# Patient Record
Sex: Female | Born: 1960 | Hispanic: No | Smoking: Never smoker
Health system: Southern US, Community
[De-identification: ages and names within clinical notes are randomized; demographics above are authoritative.]

## PROBLEM LIST (undated history)

## (undated) DIAGNOSIS — E7800 Pure hypercholesterolemia, unspecified: Secondary | ICD-10-CM

## (undated) HISTORY — PX: APPENDECTOMY: SHX54

---

## 1999-03-26 ENCOUNTER — Encounter: Payer: Self-pay | Admitting: Emergency Medicine

## 1999-03-26 ENCOUNTER — Emergency Department (HOSPITAL_COMMUNITY): Admission: EM | Admit: 1999-03-26 | Discharge: 1999-03-26 | Payer: Self-pay | Admitting: Emergency Medicine

## 1999-03-28 ENCOUNTER — Emergency Department (HOSPITAL_COMMUNITY): Admission: EM | Admit: 1999-03-28 | Discharge: 1999-03-28 | Payer: Self-pay

## 2000-07-26 ENCOUNTER — Emergency Department (HOSPITAL_COMMUNITY): Admission: EM | Admit: 2000-07-26 | Discharge: 2000-07-26 | Payer: Self-pay | Admitting: Internal Medicine

## 2012-07-02 ENCOUNTER — Emergency Department (HOSPITAL_COMMUNITY)
Admission: EM | Admit: 2012-07-02 | Discharge: 2012-07-03 | Disposition: A | Discharge status: Home / Self Care | Payer: Self-pay | Attending: Emergency Medicine | Admitting: Emergency Medicine

## 2012-07-02 ENCOUNTER — Encounter (HOSPITAL_COMMUNITY): Payer: Self-pay | Admitting: Emergency Medicine

## 2012-07-02 DIAGNOSIS — R109 Unspecified abdominal pain: Secondary | ICD-10-CM

## 2012-07-02 DIAGNOSIS — Z862 Personal history of diseases of the blood and blood-forming organs and certain disorders involving the immune mechanism: Secondary | ICD-10-CM | POA: Insufficient documentation

## 2012-07-02 DIAGNOSIS — Z7982 Long term (current) use of aspirin: Secondary | ICD-10-CM | POA: Insufficient documentation

## 2012-07-02 DIAGNOSIS — R1031 Right lower quadrant pain: Secondary | ICD-10-CM | POA: Insufficient documentation

## 2012-07-02 DIAGNOSIS — Z8639 Personal history of other endocrine, nutritional and metabolic disease: Secondary | ICD-10-CM | POA: Insufficient documentation

## 2012-07-02 HISTORY — DX: Pure hypercholesterolemia, unspecified: E78.00

## 2012-07-02 NOTE — ED Provider Notes (Signed)
History     CSN: [id number]  Arrival date & time 07/02/12  2304   First MD Initiated Contact with Patient 07/02/12 2322      Chief Complaint  Patient presents with  . Abdominal Pain    (Consider location/radiation/quality/duration/timing/severity/associated sxs/prior treatment) HPI  Donna Dixon is a generally healthy 52 yo [place] F who presents after an episode of RLQ abdominal pain. Sx began abruptly around 2130 with a feeling of being "hot all over". Pain was initially severe but resolved gradually and without intervention over the next hour. Patient has been symptom free in the ED for the past 1 hr or so.   She has associated nausea but, no vomiting. She had a single episode of non-bloody diarrhea. Denies GU sx - burning, hesitancy, hematuria. No history of similar sx. No history of abdominal surgeries.   The patient moved to [place] from [place] earlier in the month and has not received any primary medical care in several years.   Daughter notes that the patient did a lot of walking and excersize today.   Past Medical History  Diagnosis Date  . High cholesterol     History reviewed. No pertinent past surgical history.  No family history on file.  History  Substance Use Topics  . Smoking status: Never Smoker   . Smokeless tobacco: Not on file  . Alcohol Use: Yes    OB History   Grav Para Term Preterm Abortions TAB SAB Ect Mult Living                  Review of Systems Gen: no weight loss, fevers, chills, night sweats Eyes: no discharge or drainage, no occular pain or visual changes Nose: no epistaxis or rhinorrhea Mouth: no dental pain, no sore throat Neck: no neck pain Lungs: no SOB, cough, wheezing CV: no chest pain, palpitations, dependent edema or orthopnea Abd: as per history of present illness, otherwise normal GU: no dysuria or gross hematuria MSK: no myalgias or arthralgias Neuro: no headache, no focal neurologic deficits Skin: no rash Psyche:  negative.  Allergies  Review of patient's allergies indicates no known allergies.  Home Medications  No current outpatient prescriptions on file.  BP 123/69  Pulse 78  Temp(Src) 97.9 F (36.6 C) (Oral)  Resp 16  SpO2 99%  Physical Exam Gen: well developed and well nourished appearing Head: NCAT Eyes: PERL, EOMI Nose: no epistaixis or rhinorrhea Mouth/throat: mucosa is moist and pink Neck: supple, no stridor Lungs: CTA B, no wheezing, rhonchi or rales Abd: soft, notender, nondistended Back: no ttp, no cva ttp Skin: no rashese, wnl Neuro: CN ii-xii grossly intact, no focal deficits Psyche; normal affect,  calm and cooperative.   ED Course  Procedures (including critical care time)  Results for orders placed during the hospital encounter of 07/02/12 (from the past 24 hour(s))  CBC WITH DIFFERENTIAL     Status: Abnormal   Collection Time    07/02/12 11:13 PM      Result Value Range   WBC 5.8  4.0 - 10.5 K/uL   RBC 4.59  3.87 - 5.11 MIL/uL   Hemoglobin 11.1 (*) 12.0 - 15.0 g/dL   HCT 11.9 (*) 14.7 - 82.9 %   MCV 75.6 (*) 78.0 - 100.0 fL   MCH 24.2 (*) 26.0 - 34.0 pg   MCHC 32.0  30.0 - 36.0 g/dL   RDW 56.2 (*) 13.0 - 86.5 %   Platelets 176  150 - 400 K/uL  Neutrophils Relative % 49  43 - 77 %   Neutro Abs 2.8  1.7 - 7.7 K/uL   Lymphocytes Relative 41  12 - 46 %   Lymphs Abs 2.4  0.7 - 4.0 K/uL   Monocytes Relative 8  3 - 12 %   Monocytes Absolute 0.5  0.1 - 1.0 K/uL   Eosinophils Relative 1  0 - 5 %   Eosinophils Absolute 0.1  0.0 - 0.7 K/uL   Basophils Relative 1  0 - 1 %   Basophils Absolute 0.1  0.0 - 0.1 K/uL  COMPREHENSIVE METABOLIC PANEL     Status: Abnormal   Collection Time    07/02/12 11:13 PM      Result Value Range   Sodium 142  135 - 145 mEq/L   Potassium 4.2  3.5 - 5.1 mEq/L   Chloride 104  96 - 112 mEq/L   CO2 25  19 - 32 mEq/L   Glucose, Bld 98  70 - 99 mg/dL   BUN 15  6 - 23 mg/dL   Creatinine, Ser 1.61  0.50 - 1.10 mg/dL   Calcium 9.4   8.4 - 09.6 mg/dL   Total Protein 7.1  6.0 - 8.3 g/dL   Albumin 3.7  3.5 - 5.2 g/dL   AST 40 (*) 0 - 37 U/L   ALT 25  0 - 35 U/L   Alkaline Phosphatase 61  39 - 117 U/L   Total Bilirubin 0.2 (*) 0.3 - 1.2 mg/dL   GFR calc non Af Amer >90  >90 mL/min   GFR calc Af Amer >90  >90 mL/min  LIPASE, BLOOD     Status: None   Collection Time    07/02/12 11:13 PM      Result Value Range   Lipase 46  11 - 59 U/L  URINALYSIS, ROUTINE W REFLEX MICROSCOPIC     Status: None   Collection Time    07/03/12 12:12 AM      Result Value Range   Color, Urine YELLOW  YELLOW   APPearance CLEAR  CLEAR   Specific Gravity, Urine 1.016  1.005 - 1.030   pH 7.5  5.0 - 8.0   Glucose, UA NEGATIVE  NEGATIVE mg/dL   Hgb urine dipstick NEGATIVE  NEGATIVE   Bilirubin Urine NEGATIVE  NEGATIVE   Ketones, ur NEGATIVE  NEGATIVE mg/dL   Protein, ur NEGATIVE  NEGATIVE mg/dL   Urobilinogen, UA 0.2  0.0 - 1.0 mg/dL   Nitrite NEGATIVE  NEGATIVE   Leukocytes, UA NEGATIVE  NEGATIVE      MDM  Patient pain free throughout ED stay. Exam is benign. VS are wnl. CBC, CMP, lipase and U/A are wnl.  Patient is stable for discharge.         Brandt Loosen, MD 07/03/12 [phone]

## 2012-07-02 NOTE — ED Notes (Signed)
PT. REPORTS RLQ PAIN WITH EMESIS AND DIARRHEA / SLIGHT CHILLS ONSET THIS EVENING , DENIES DYSURIA OR FEVER . DAUGHTER TRANSLATING ( LAOSIAN  ) FOR PT.

## 2012-07-03 LAB — CBC WITH DIFFERENTIAL/PLATELET
Basophils Absolute: 0.1 10*3/uL (ref 0.0–0.1)
Basophils Relative: 1 % (ref 0–1)
Eosinophils Relative: 1 % (ref 0–5)
HCT: 34.7 % — ABNORMAL LOW (ref 36.0–46.0)
Lymphocytes Relative: 41 % (ref 12–46)
MCHC: 32 g/dL (ref 30.0–36.0)
MCV: 75.6 fL — ABNORMAL LOW (ref 78.0–100.0)
Monocytes Absolute: 0.5 10*3/uL (ref 0.1–1.0)
RDW: 15.6 % — ABNORMAL HIGH (ref 11.5–15.5)

## 2012-07-03 LAB — URINALYSIS, ROUTINE W REFLEX MICROSCOPIC
Glucose, UA: NEGATIVE mg/dL
Leukocytes, UA: NEGATIVE
Nitrite: NEGATIVE
Protein, ur: NEGATIVE mg/dL
Urobilinogen, UA: 0.2 mg/dL (ref 0.0–1.0)

## 2012-07-03 LAB — COMPREHENSIVE METABOLIC PANEL
AST: 40 U/L — ABNORMAL HIGH (ref 0–37)
CO2: 25 mEq/L (ref 19–32)
Calcium: 9.4 mg/dL (ref 8.4–10.5)
Creatinine, Ser: 0.66 mg/dL (ref 0.50–1.10)
GFR calc Af Amer: >90 mL/min (ref >90)
GFR calc non Af Amer: >90 mL/min (ref >90)

## 2012-07-03 LAB — LIPASE, BLOOD: Lipase: 46 U/L (ref 11–59)

## 2012-07-03 NOTE — ED Notes (Signed)
[place] pt presenets with daughter who is translating for pt.  Pt began having acute abd pain and vomiting at 2130 hrs 07/03/12.  Pain located in RLQ without radaition, but associated with nausea, vomiting x 1.  Had diarrhea stool times one prior to pain.  At present pt is painfree

## 2018-12-04 ENCOUNTER — Emergency Department (HOSPITAL_BASED_OUTPATIENT_CLINIC_OR_DEPARTMENT_OTHER): Payer: Self-pay

## 2018-12-04 ENCOUNTER — Emergency Department (HOSPITAL_BASED_OUTPATIENT_CLINIC_OR_DEPARTMENT_OTHER)
Admission: EM | Admit: 2018-12-04 | Discharge: 2018-12-04 | Disposition: A | Discharge status: Home / Self Care | Payer: Self-pay | Attending: Emergency Medicine | Admitting: Emergency Medicine

## 2018-12-04 ENCOUNTER — Other Ambulatory Visit: Payer: Self-pay

## 2018-12-04 ENCOUNTER — Encounter (HOSPITAL_BASED_OUTPATIENT_CLINIC_OR_DEPARTMENT_OTHER): Payer: Self-pay

## 2018-12-04 DIAGNOSIS — M25562 Pain in left knee: Secondary | ICD-10-CM

## 2018-12-04 DIAGNOSIS — Z79899 Other long term (current) drug therapy: Secondary | ICD-10-CM | POA: Insufficient documentation

## 2018-12-04 NOTE — Discharge Instructions (Signed)
Take Tylenol and Ibuprofen as needed for pain. Do not take more than 4000 mg Tylenol or more than 2400 mg Ibuprofen daily. Ice, elevated extremity.  Follow up with Orthopedics for reevaluation.

## 2018-12-04 NOTE — ED Provider Notes (Addendum)
[facility] EMERGENCY DEPARTMENT Provider Note   CSN: [id number] Arrival date & time: 12/04/18  1631     History   Chief Complaint Chief Complaint  Patient presents with  . Fall    HPI Donna Dixon is a 58 y.o. female with past medical history who presents for evaluation of knee pain.  Patient states she tripped and fell while stepping over a dog gate.  She landed on her left knee.  She denies hitting her head, LOC or anticoagulation.  She was ambulatory after the fall.  Patient states she has anterior left knee pain.  Worse with ambulation.  She does not take anything for pain.  She rates her current pain a 3/10.  Denies redness, swelling, warmth, decreased range of motion, fever, nausea, vomiting, paresthesias to extremities.  And additional aggravating or alleviating factors.  She has been ambulatory at home without difficulty.  Denies additional aggravating or alleviating factors. She does not want anything for pain in the ED.  History obtained from patient and past medical records.  Patient needed languages [place].  She refuses medical interpreter.  She insists that her daughter in room interprets for her.     HPI  Past Medical History:  Diagnosis Date  . High cholesterol     There are no active problems to display for this patient.   Past Surgical History:  Procedure Laterality Date  . APPENDECTOMY       OB History   No obstetric history on file.      Home Medications    Prior to Admission medications   Medication Sig Start Date End Date Taking? Authorizing Provider  Aspirin-Acetaminophen-Caffeine (EXCEDRIN PO) Take 1 tablet by mouth daily as needed (for pain).    [provider]  Multiple Vitamin (MULTIVITAMIN WITH MINERALS) TABS Take 1 tablet by mouth daily.    [provider]    Family History No family history on file.  Social History Social History   Tobacco Use  . Smoking status: Never Smoker  . Smokeless tobacco:  Never Used  Substance Use Topics  . Alcohol use: Yes    Frequency: Never  . Drug use: No     Allergies   Patient has no known allergies.   Review of Systems Review of Systems  Constitutional: Negative.   HENT: Negative.   Respiratory: Negative.   Cardiovascular: Negative.   Gastrointestinal: Negative.   Genitourinary: Negative.   Musculoskeletal:       Left knee pain  Neurological: Negative.   All other systems reviewed and are negative.    Physical Exam Updated Vital Signs BP (!) 167/85 (BP Location: Right Arm)   Pulse 62   Temp 99 F (37.2 C) (Oral)   Resp 20   Ht 5\' 1"  (1.549 m)   Wt 49.9 kg   SpO2 98%   BMI 20.78 kg/m   Physical Exam Vitals signs and nursing note reviewed.  Constitutional:      General: She is not in acute distress.    Appearance: She is well-developed. She is not ill-appearing or toxic-appearing.  HENT:     Head: Normocephalic and atraumatic.     Nose: Nose normal.     Mouth/Throat:     Mouth: Mucous membranes are moist.     Pharynx: Oropharynx is clear.  Eyes:     Pupils: Pupils are equal, round, and reactive to light.  Neck:     Musculoskeletal: Normal range of motion.  Cardiovascular:  Rate and Rhythm: Normal rate.  Pulmonary:     Effort: No respiratory distress.  Abdominal:     General: There is no distension.  Musculoskeletal: Normal range of motion.     Right hip: Normal.     Left hip: Normal.     Right knee: Normal.     Left knee: She exhibits normal range of motion, no swelling, no effusion, no ecchymosis, no deformity, no laceration, no erythema, normal alignment, no LCL laxity, normal patellar mobility, normal meniscus and no MCL laxity. Tenderness found. No medial joint line, no lateral joint line, no MCL, no LCL and no patellar tendon tenderness noted.     Right ankle: Normal.     Left ankle: Normal.     Left upper leg: Normal.     Right lower leg: Normal.     Left lower leg: Normal.       Legs:     Right  foot: Normal.     Left foot: Normal.     Comments: Full range of motion to bilateral knees with flexion, extension.  Negative varus, valgus stress to the left knee.  Negative anterior drawer.  No tenderness to tibia/fibula, femur.  Calves nontender bilaterally.  Compartments soft.  Tenderness to anterior left knee.  Able to straight leg raise without difficulty.  Pelvis stable to palpation.  No shortening or rotation of legs.  Skin:    General: Skin is warm and dry.     Capillary Refill: Capillary refill takes less than 2 seconds.     Comments: Old circular scar to left anterior knee approximately 2 x 1 cm.  No edema, erythema, ecchymosis or warmth.  No contusions or abrasions.  No lacerations.  Neurological:     Mental Status: She is alert.     Comments: Ambulatory without difficulty.  Intact sensation. 5/5 bilateral lower extremities without difficulty.     ED Treatments / Results  Labs (all labs ordered are listed, but only abnormal results are displayed) Labs Reviewed - No data to display  EKG None  Radiology Dg Knee Complete 4 Views Left  Result Date: 12/04/2018 CLINICAL DATA:  Fall with knee pain EXAM: LEFT KNEE - COMPLETE 4+ VIEW COMPARISON:  None. FINDINGS: An overlying elastic brace limits the ability to detect subtle fractures. No evidence of fracture, dislocation, or joint effusion. No evidence of arthropathy or other focal bone abnormality. Soft tissues are unremarkable. IMPRESSION: Negative. Electronically Signed   By: Romona Curls M.D.   On: 12/04/2018 17:30    Procedures Procedures (including critical care time)  Medications Ordered in ED Medications - No data to display  Initial Impression / Assessment and Plan / ED Course  I have reviewed the triage vital signs and the nursing notes.  Pertinent labs & imaging results that were available during my care of the patient were reviewed by me and considered in my medical decision making (see chart for details).   58 year old female appears otherwise well presents for evaluation of left knee pain after mechanical fall which occurred yesterday.  Denies hitting head, LOC or anticoagulation.  Pelvis stable to palpation and nontender.  No shortening or rotation of legs.  She has been ambulatory without difficulty.  She does have some mild tenderness to her anterior left patella.  Full range of motion without difficulty.  Neurovascularly intact.  Negative varus, valgus stress.  Negative anterior drawer.  She is able to straight leg raise without difficulty. Compartments soft. No obvious effusion on exam. No  evidence of vascular, tendon, ligament or acute muscle injury. She does not want thing for pain in the ED here today. Low suspicion for septic joint, gout given known injury.  RICE for symptomatic management. She already has a brace on her knee.  Follow-up with Ortho for reevaluation if symptoms do not improve.  The patient has been appropriately medically screened and/or stabilized in the ED. I have low suspicion for any other emergent medical condition which would require further screening, evaluation or treatment in the ED or require inpatient management.      Final Clinical Impressions(s) / ED Diagnoses   Final diagnoses:  Acute pain of left knee    ED Discharge Orders    None       Elray Dains A, PA-C 12/04/18 1735    Yanique Mulvihill A, PA-C 12/04/18 1736    Long, Arlyss RepressJoshua G, MD 12/05/18 1014

## 2018-12-04 NOTE — ED Triage Notes (Signed)
Pt speaks [place], pt's daughter in triage to interpret for pt. Pt refused using medical interpreter service.   Pt had a mechanical fall yesterday tripping over a barrier and landing on her L knee. Denies striking her head. Pt c/o L knee pain.

## 2021-06-14 IMAGING — CR DG KNEE COMPLETE 4+V*L*
4 series · 4 of 4 positions shown · non-contrast
Comparison: None.

CLINICAL DATA: Fall with knee pain

EXAM:
LEFT KNEE - COMPLETE 4+ VIEW

[t knee ap left]
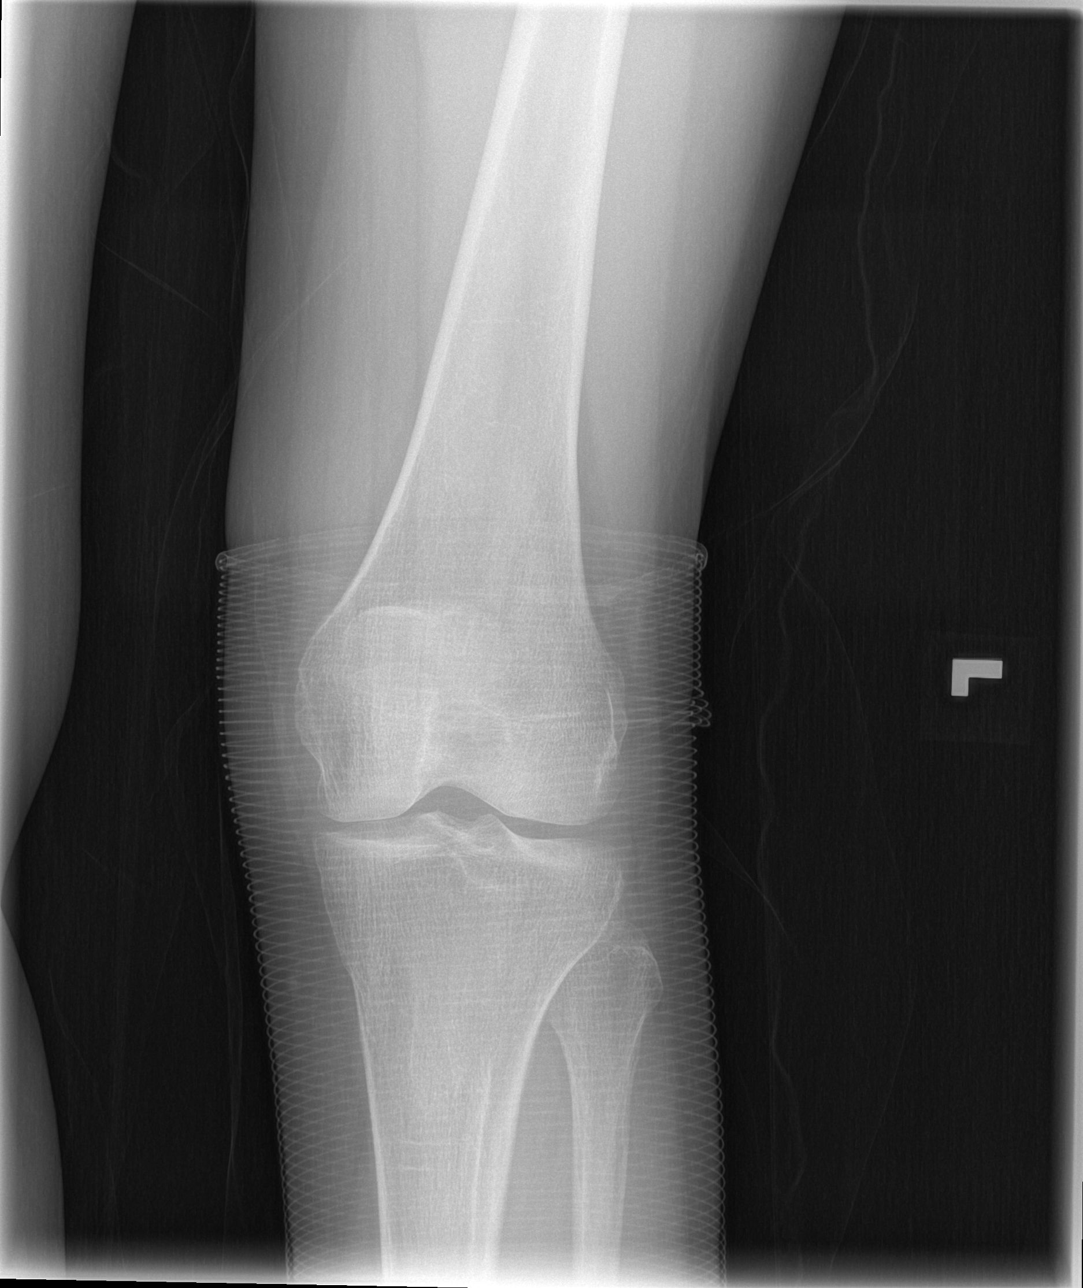

[t knee oblique left (1 of 2)]
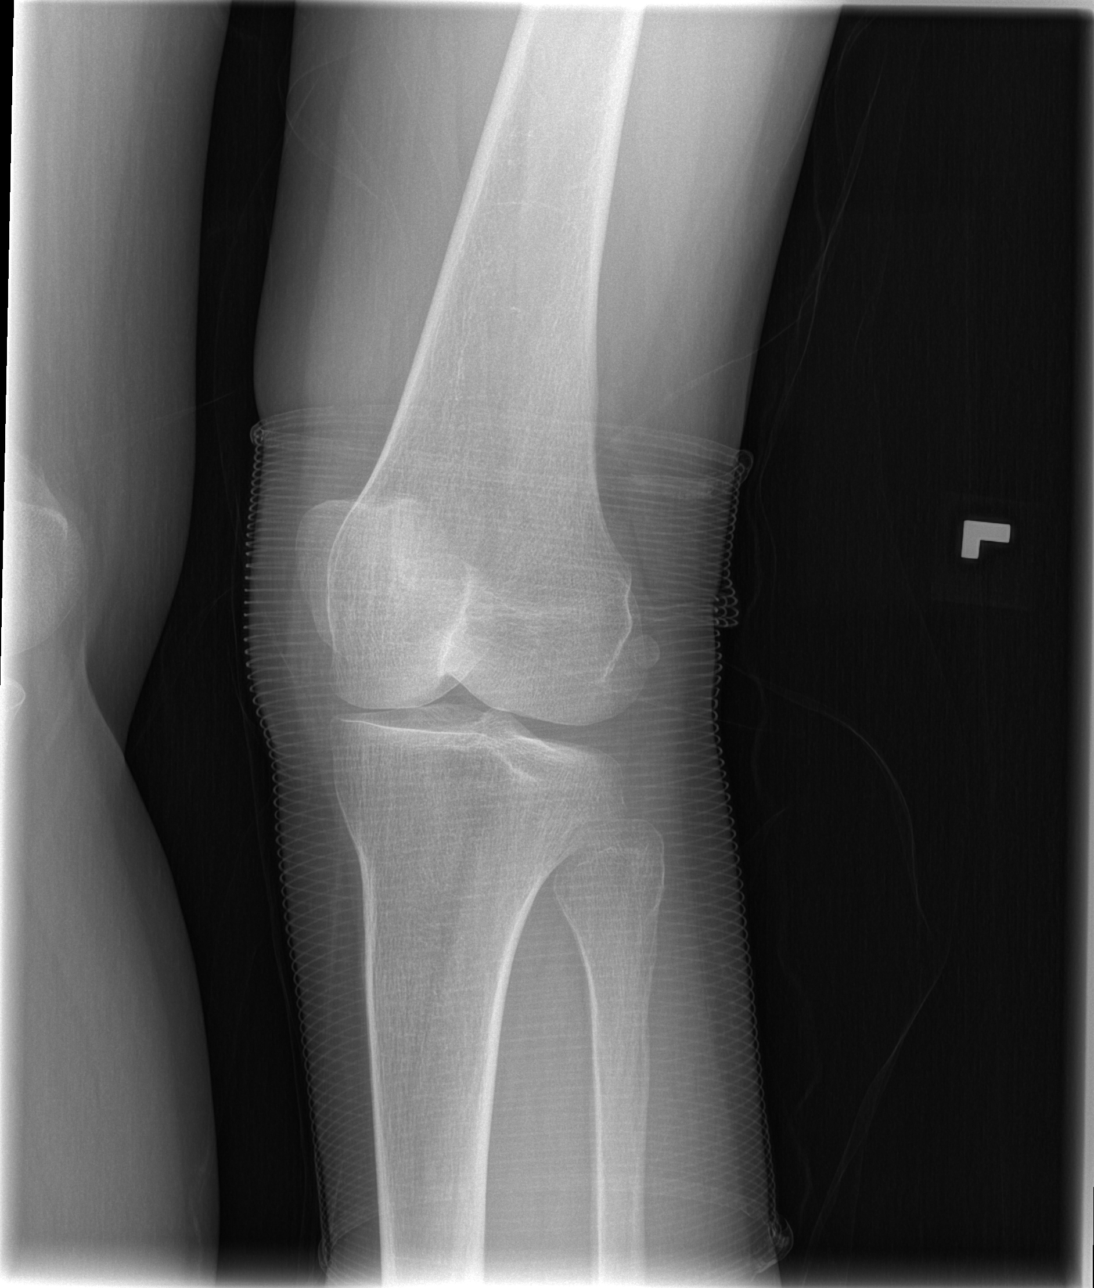

[t knee oblique left (2 of 2)]
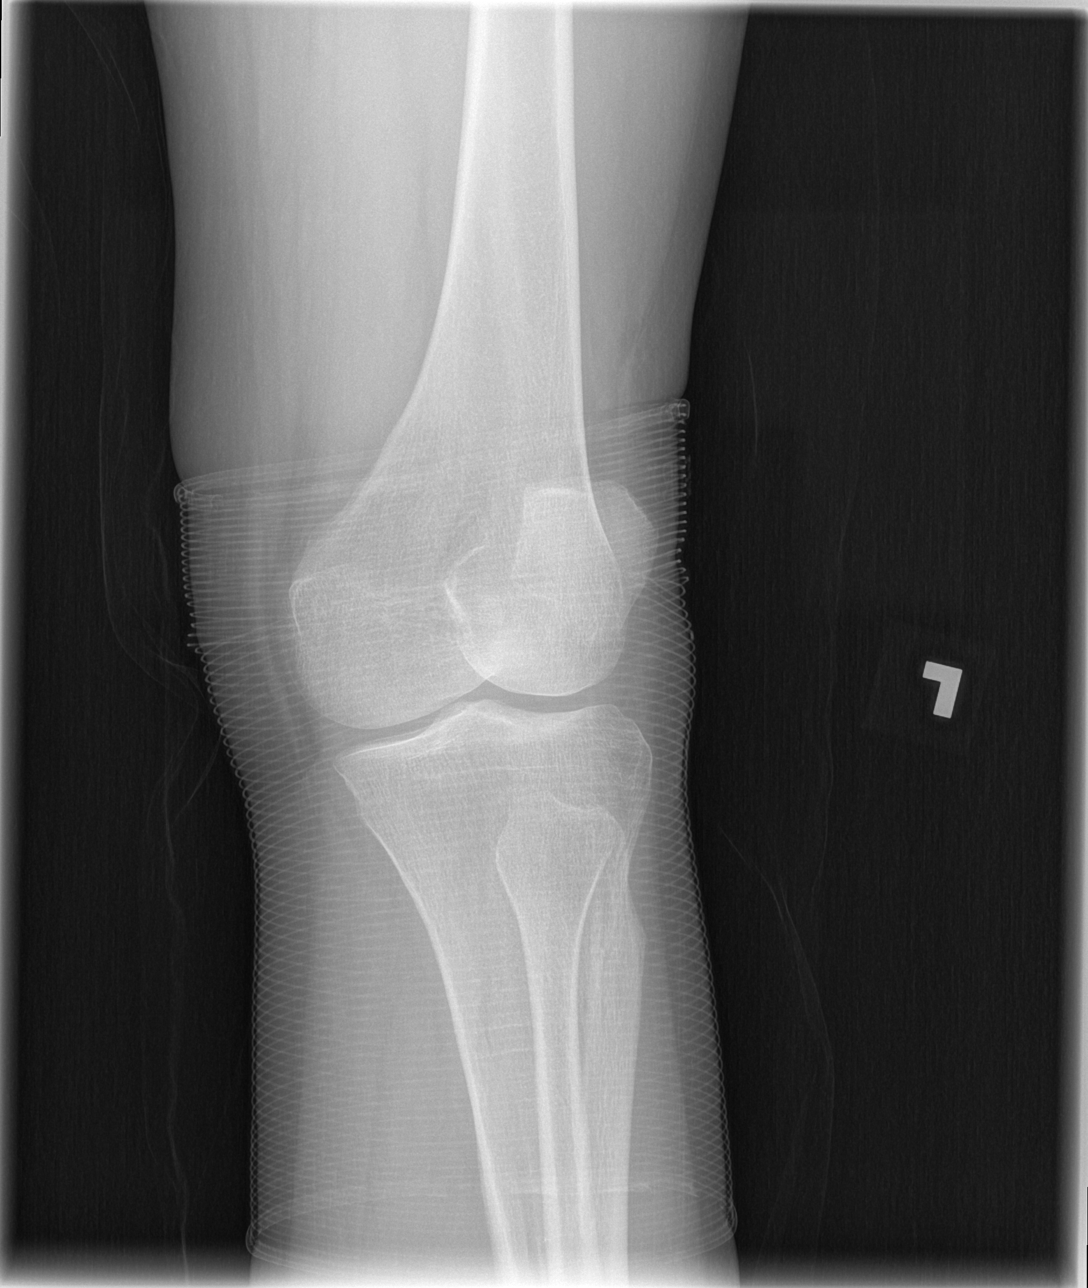

[t knee lat left]
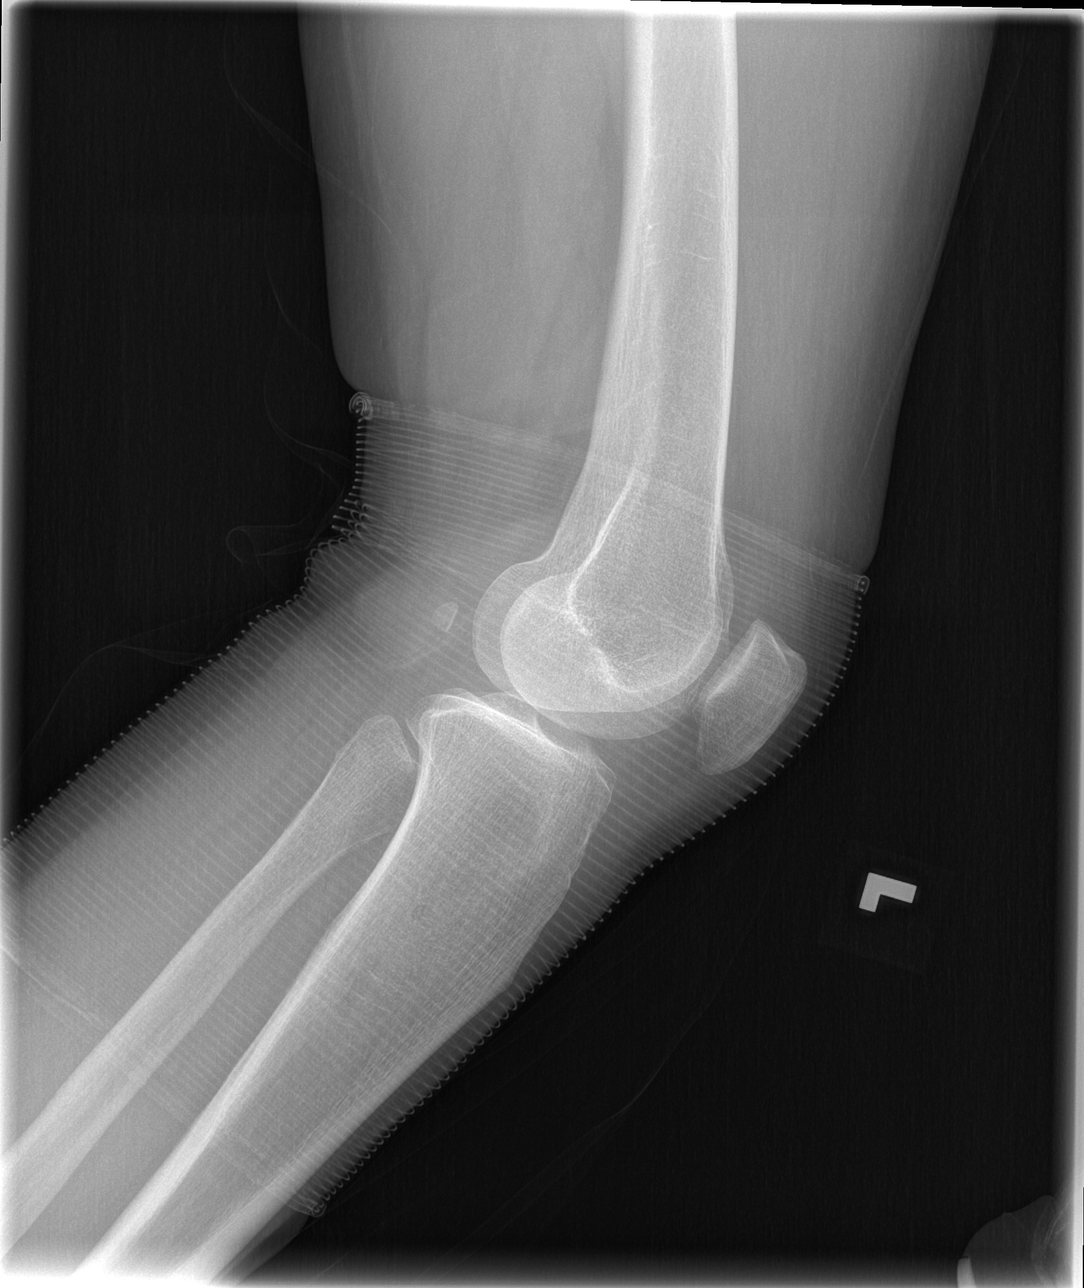

[4 of 4 positions shown; findings below may reference images not displayed]

FINDINGS: An overlying elastic brace limits the ability to detect subtle
fractures. No evidence of fracture, dislocation, or joint effusion.
No evidence of arthropathy or other focal bone abnormality. Soft
tissues are unremarkable.
IMPRESSION: Negative.

## 2023-01-20 ENCOUNTER — Emergency Department (HOSPITAL_COMMUNITY): Payer: Self-pay

## 2023-01-20 ENCOUNTER — Encounter (HOSPITAL_COMMUNITY): Payer: Self-pay

## 2023-01-20 ENCOUNTER — Emergency Department (HOSPITAL_COMMUNITY)
Admission: EM | Admit: 2023-01-20 | Discharge: 2023-01-20 | Disposition: A | Discharge status: Home / Self Care | Payer: Self-pay | Attending: Emergency Medicine | Admitting: Emergency Medicine

## 2023-01-20 ENCOUNTER — Other Ambulatory Visit: Payer: Self-pay

## 2023-01-20 DIAGNOSIS — W010XXA Fall on same level from slipping, tripping and stumbling without subsequent striking against object, initial encounter: Secondary | ICD-10-CM | POA: Insufficient documentation

## 2023-01-20 DIAGNOSIS — S82032A Displaced transverse fracture of left patella, initial encounter for closed fracture: Secondary | ICD-10-CM | POA: Insufficient documentation

## 2023-01-20 MED ORDER — HYDROCODONE-ACETAMINOPHEN 5-325 MG PO TABS: 1.0000 | ORAL_TABLET | Freq: Four times a day (QID) | ORAL | 0 refills | Status: DC | PRN

## 2023-01-20 MED ORDER — OXYCODONE-ACETAMINOPHEN 5-325 MG PO TABS
1.0000 | ORAL_TABLET | Freq: Once | ORAL | Status: AC
  Administered 2023-01-20: 1 via ORAL
  Filled 2023-01-20: qty 1

## 2023-01-20 MED ORDER — IBUPROFEN 200 MG PO TABS
600.0000 mg | ORAL_TABLET | Freq: Once | ORAL | Status: AC
  Administered 2023-01-20: 600 mg via ORAL
  Filled 2023-01-20: qty 1

## 2023-01-20 MED ORDER — IBUPROFEN 600 MG PO TABS: 600.0000 mg | ORAL_TABLET | Freq: Four times a day (QID) | ORAL | 0 refills | Status: DC | PRN

## 2023-01-20 NOTE — ED Notes (Signed)
Ortho tech called for immobilizer and crutches

## 2023-01-20 NOTE — Progress Notes (Signed)
Orthopedic Tech Progress Note Patient Details:  Donna Dixon [birth year] [id number] Applied knee immobilizer and trained on used of crutches  Ortho Devices Type of Ortho Device: Knee Immobilizer, Crutches Ortho Device/Splint Location: LLE Ortho Device/Splint Interventions: Ordered, Application, Adjustment   Post Interventions Patient Tolerated: Well Instructions Provided: Adjustment of device, Care of device  [name] 01/20/2023, 6:41 PM

## 2023-01-20 NOTE — ED Provider Triage Note (Signed)
Emergency Medicine Provider Triage Evaluation Note  Cheridan Nawabi , a 62 y.o. female  was evaluated in triage.  Pt complains of knee injury. Had a mechanical fall this AM when she slipped on wet ground and fell, struck her L knee.  No other injury.  Not on blood thinner.    Review of Systems  Positive: As above Negative: As above  Physical Exam  BP (!) 163/87   Pulse 81   Temp 98.4 F (36.9 C)   Resp 16   Ht 5\' 1"  (1.549 m)   Wt 59 kg   SpO2 98%   BMI 24.56 kg/m  Gen:   Awake, no distress   Resp:  Normal effort  MSK:   Moves extremities without difficulty  Other:    Medical Decision Making  Medically screening exam initiated at 2:05 PM.  Appropriate orders placed.  Sharniece Kamp was informed that the remainder of the evaluation will be completed by another provider, this initial triage assessment does not replace that evaluation, and the importance of remaining in the ED until their evaluation is complete.     Fayrene Helper, PA-C 01/20/23 1406

## 2023-01-20 NOTE — ED Triage Notes (Signed)
Pt came to ED for a fall today. Denies hitting head and no blood thinners. C/O left knee pain. Knee is bruised and swollen.

## 2023-01-20 NOTE — Discharge Instructions (Addendum)
Was a pleasure taking care of you.  You were seen today for a knee injury, you have a displaced fracture of the lateral portion of your left patella (knee cap).  Your knee in the immobilizer, use crutches, follow-up with orthopedics.  Do not put weight on your leg until you see orthopedics.  I spoke with Dr. Jena Gauss today.  He advised to call tomorrow to schedule appointment for next week.

## 2023-01-20 NOTE — ED Notes (Signed)
Ortho at bedside.

## 2023-01-20 NOTE — ED Provider Notes (Signed)
[place] EMERGENCY DEPARTMENT AT [facility] Provider Note   CSN: [id number] Arrival date & time: 01/20/23  1250     History  Chief Complaint  Patient presents with   [facility] [place] is a 62 y.o. female.  She has no stated past medical history, patient speaks [place].  She is here with her daughter, they refused interpreter opting for her daughter to translate.  They report patient slipped on the wet ground today and fell to her left knee.  Having pain and swelling.  Denies head injury or loss of consciousness, she is not on blood thinners.  She denies numbness or tingling of her leg.  Fall       Home Medications Prior to Admission medications   Medication Sig Start Date End Date Taking? Authorizing Provider  HYDROcodone-acetaminophen (NORCO) 5-325 MG tablet Take 1 tablet by mouth every 6 (six) hours as needed for moderate pain (pain score 4-6). 01/20/23  Yes Ebba Goll A, PA-C  ibuprofen (ADVIL) 600 MG tablet Take 1 tablet (600 mg total) by mouth every 6 (six) hours as needed. 01/20/23  Yes Lanayah Gartley A, PA-C  Aspirin-Acetaminophen-Caffeine (EXCEDRIN PO) Take 1 tablet by mouth daily as needed (for pain).    [provider]  Multiple Vitamin (MULTIVITAMIN WITH MINERALS) TABS Take 1 tablet by mouth daily.    [provider]      Allergies    Patient has no known allergies.    Review of Systems   Review of Systems  Physical Exam Updated Vital Signs BP (!) 179/90 (BP Location: Right Arm)   Pulse 68   Temp 98.4 F (36.9 C) (Oral)   Resp 18   Ht 5\' 1"  (1.549 m)   Wt 59 kg   SpO2 100%   BMI 24.56 kg/m  Physical Exam Vitals and nursing note reviewed.  Constitutional:      General: She is not in acute distress.    Appearance: She is well-developed.  HENT:     Head: Normocephalic and atraumatic.  Eyes:     Extraocular Movements: Extraocular movements intact.     Conjunctiva/sclera: Conjunctivae normal.  Cardiovascular:      Rate and Rhythm: Normal rate and regular rhythm.     Heart sounds: No murmur heard. Pulmonary:     Effort: Pulmonary effort is normal. No respiratory distress.     Breath sounds: Normal breath sounds.  Musculoskeletal:        General: Swelling present. No tenderness. Normal range of motion.     Cervical back: Neck supple.     Comments: Left anterior knee swelling and  Skin:    General: Skin is warm and dry.     Capillary Refill: Capillary refill takes less than 2 seconds.     Comments: Ecchymosis overlying left patella with no laceration or abrasion  Neurological:     General: No focal deficit present.     Mental Status: She is alert and oriented to person, place, and time.     Motor: No weakness.  Psychiatric:        Mood and Affect: Mood normal.     ED Results / Procedures / Treatments   Labs (all labs ordered are listed, but only abnormal results are displayed) Labs Reviewed - No data to display  EKG None  Radiology DG Knee Complete 4 Views Left  Result Date: 01/20/2023 CLINICAL DATA:  fall.  Pain to left knee. EXAM: LEFT KNEE - COMPLETE 4+ VIEW  COMPARISON:  12/04/2018. FINDINGS: There is acute and displaced fracture of the lower portion of the patella. There is significant prepatellar soft tissue swelling/hematoma. No other acute fracture or dislocation. No aggressive osseous lesion. There are degenerative changes of the knee joint in the form of mildly reduced medial tibio-femoral compartment joint space and minimal tibial spiking. No radiopaque foreign bodies. IMPRESSION: *Acute and displaced fracture of the lower portion of patella with associated prepatellar soft tissue swelling/hematoma. Electronically Signed   By: Jules Schick M.D.   On: 01/20/2023 15:39    Procedures Procedures    Medications Ordered in ED Medications  oxyCODONE-acetaminophen (PERCOCET/ROXICET) 5-325 MG per tablet 1 tablet (1 tablet Oral Given 01/20/23 1415)  ibuprofen (ADVIL) tablet 600  mg (600 mg Oral Given 01/20/23 1740)    ED Course/ Medical Decision Making/ A&P Clinical Course as of 01/21/23 0000  Wed Jan 20, 2023  1739 Patient here for fall with direct trauma to the patella, she has swelling and prepatellar effusion on exam likely hemarthrosis.  X-ray shows a displaced patellar fracture.  Patient is able to flex and extend knee fully, and pulses are excellent with good capillary refill of the feet.   [CB]    Clinical Course User Index [CB] Ma Rings, PA-C                                 Medical Decision Making This patient presents to the ED for concern of left knee pain, this involves an extensive number of treatment options, and is a complaint that carries with it a high risk of complications and morbidity.  The differential diagnosis includes fracture, dislocation, meniscal injury, ligamentous injury, contusion, other     Additional history obtained:  Additional history obtained from EMR External records from outside source obtained and reviewed including prior notes     Imaging Studies ordered:  I ordered imaging studies including x-ray of left knee which shows acute displaced fracture of lower portion of patella I independently visualized and interpreted imaging within scope of identifying emergent findings  I agree with the radiologist interpretation   Consultations Obtained:  I requested consultation with the orthopedic surgeon Dr. Jena Gauss,  and discussed lab and imaging findings as well as pertinent plan - they recommend: Discharged home with knee immobilizer and crutches and follow-up next week, call tomorrow to schedule appointment   Problem List / ED Course / Critical interventions / Medication management  Left knee pain after mechanical fall today.  Patient is not on blood thinners. I ordered medication including oxycodone and ibuprofen for pain  Reevaluation of the patient after these medicines showed that the patient improved I  have reviewed the patients home medicines and have made adjustments as needed       Risk Prescription drug management.           Final Clinical Impression(s) / ED Diagnoses Final diagnoses:  Closed displaced transverse fracture of left patella, initial encounter    Rx / DC Orders ED Discharge Orders          Ordered    ibuprofen (ADVIL) 600 MG tablet  Every 6 hours PRN        01/20/23 1801    HYDROcodone-acetaminophen (NORCO) 5-325 MG tablet  Every 6 hours PRN        01/20/23 1801              Ma Rings, PA-C 01/21/23  0000    Laurence Spates, MD 01/21/23 [phone]

## 2023-01-26 ENCOUNTER — Ambulatory Visit: Payer: Self-pay | Admitting: Student

## 2023-01-26 NOTE — H&P (Signed)
[facility] ([facility]) H&P  Patient ID: Lun Breit MRN: [id number] DOB/AGE: 1960-04-24 62 y.o.  Reason for surgery: Left patella fracture  HPI: Calisa Dixon is an 62 y.o. female with no significant past medical history presenting for surgery on left lower extremity.  Patient slipped in the rain on 01/20/2023, landing on the left knee.  Had immediate pain in the left knee was unable to weight-bear.  Was seen in the emergency department and found to have a left transverse patella fracture.  Patient placed in a knee immobilizer instructed follow-up with orthopedics.  Present to the OTS clinic on 01/26/2023 for reevaluation.  Pain has been fairly well-controlled.  She has been compliant with nonweightbearing left lower extremity.  Has been wearing the knee immobilizer regularly.  Ambulating with crutches.  Denies any significant numbness or tingling about the left lower extremity.  No other injuries from the fall.  No previous injury or surgery to the left lower extremity. Not currently on any anticoagulation.  Ambulates at baseline with no assistive device.  Patient not currently working.  Past Medical History:  Diagnosis Date   High cholesterol     Past Surgical History:  Procedure Laterality Date   APPENDECTOMY      No family history on file.  Social History:  reports that she has never smoked. She has never used smokeless tobacco. She reports current alcohol use. She reports that she does not use drugs.  Allergies: No Known Allergies  Medications: I have reviewed the patient's current medications. Prior to Admission:  No medications prior to admission.    ROS: Constitutional: No fever or chills Vision: No changes in vision ENT: No difficulty swallowing CV: No chest pain Pulm: No SOB or wheezing GI: No nausea or vomiting GU: No urgency or inability to hold urine Skin: + bruising L knee Neurologic: No numbness or tingling Psychiatric: No depression or  anxiety Heme: No bruising Allergic: No reaction to medications or food   Exam: There were no vitals taken for this visit. General:NAD Orientation:A&O x 4 Mood and Affect: Mood and affect appropriate x 4 Gait: NWB LLE in knee immobilizer Coordination and balance: WNL  LLE: Notable bruising/swelling over the knee and distal thigh. Does have skin wrinkling over the knee. No significant tenderness through the calf. Ankle DF/PF intact. Foot warm and well perfused  RLE: Skin without lesions. No tenderness to palpation. Full painless ROM, full strength in each muscle group without evidence of instability. Motor and sensory function at baseline. + DP pulse   Medical Decision Making: Data: Imaging: AP and lateral views of the left knee shows displaced transverse patella fracture  Labs: No results found for this or any previous visit (from the past week).  Assessment/Plan: 62 year old female with left transverse patella fracture.  Patient with significant injury to left lower extremity which require surgical intervention.  I recommend proceeding with open duction internal fixation of left patella fracture.  Risk and benefits of jejunum cyst with patient and her daughter.Risks discussed included bleeding requiring blood transfusion, bleeding causing a hematoma, infection, malunion, nonunion, damage to surrounding nerves and blood vessels, pain, hardware prominence or irritation, hardware failure, stiffness, post-traumatic arthritis, DVT/PE, compartment syndrome, and even anesthesia complications.  Patient and her daughter state their understanding of the risk.  They agreed proceed with surgery.  Consent will be obtained.  Will plan to discharge patient home postoperatively and allow her to be weightbearing as tolerated on the left lower extremity with her knee maintained  in full extension.   Thompson Caul PA-C [facility] [phone] (office) [email or web address]

## 2023-01-27 ENCOUNTER — Encounter (HOSPITAL_COMMUNITY): Payer: Self-pay | Admitting: Student

## 2023-01-27 ENCOUNTER — Other Ambulatory Visit: Payer: Self-pay

## 2023-01-27 NOTE — Progress Notes (Signed)
PCP - No PCP Cardiologist -   PPM/ICD - denies Device Orders - n/a Rep Notified - n/a  Chest x-ray - denies EKG - denies Stress Test - denies ECHO - denies Cardiac Cath - denies  CPAP - denies   DM - denies  Blood Thinner Instructions: denies Aspirin Instructions: n/a  ERAS Protcol - clear liquids until 420 am  COVID TEST- no  Anesthesia review: no  Patient verbally denies any shortness of breath, fever, cough and chest pain during phone call   -------------  SDW INSTRUCTIONS given:  Your procedure is scheduled on January 29, 2023.  Report to [facility] Main Entrance "A" at 5:30 A.M., and check in at the Admitting office.  Call this number if you have problems the morning of surgery:  [phone]   Remember:  Do not eat after midnight the night before your surgery      Take these medicines the morning of surgery with A SIP OF WATER  HYDROcodone-acetaminophen (NORCO)   As of today, STOP taking any Aspirin (unless otherwise instructed by your surgeon) Aleve, Naproxen, Ibuprofen, Motrin, Advil, Goody's, BC's, all herbal medications, fish oil, and all vitamins.                      Do not wear jewelry, make up, or nail polish            Do not wear lotions, powders, perfumes/colognes, or deodorant.            Do not shave 48 hours prior to surgery.  Men may shave face and neck.            Do not bring valuables to the hospital.            [facility] is not responsible for any belongings or valuables.  Do NOT Smoke (Tobacco/Vaping) 24 hours prior to your procedure If you use a CPAP at night, you may bring all equipment for your overnight stay.   Contacts, glasses, dentures or bridgework may not be worn into surgery.      For patients admitted to the hospital, discharge time will be determined by your treatment team.   Patients discharged the day of surgery will not be allowed to drive home, and someone needs to stay with them for 24 hours.    Special  instructions:   [place]- Preparing For Surgery  Before surgery, you can play an important role. Because skin is not sterile, your skin needs to be as free of germs as possible. You can reduce the number of germs on your skin by washing with CHG (chlorahexidine gluconate) Soap before surgery.  CHG is an antiseptic cleaner which kills germs and bonds with the skin to continue killing germs even after washing.    Oral Hygiene is also important to reduce your risk of infection.  Remember - BRUSH YOUR TEETH THE MORNING OF SURGERY WITH YOUR REGULAR TOOTHPASTE  Please do not use if you have an allergy to CHG or antibacterial soaps. If your skin becomes reddened/irritated stop using the CHG.  Do not shave (including legs and underarms) for at least 48 hours prior to first CHG shower. It is OK to shave your face.  Please follow these instructions carefully.   Shower the NIGHT BEFORE SURGERY and the MORNING OF SURGERY with DIAL Soap.   Pat yourself dry with a CLEAN TOWEL.  Wear CLEAN PAJAMAS to bed the night before surgery  Place CLEAN SHEETS on your  bed the night of your first shower and DO NOT SLEEP WITH PETS.   Day of Surgery: Please shower morning of surgery  Wear Clean/Comfortable clothing the morning of surgery Do not apply any deodorants/lotions.   Remember to brush your teeth WITH YOUR REGULAR TOOTHPASTE.   Questions were answered. Patient verbalized understanding of instructions.

## 2023-01-28 NOTE — Anesthesia Preprocedure Evaluation (Addendum)
Anesthesia Evaluation  Patient identified by MRN, date of birth, ID band Patient awake    Reviewed: Allergy & Precautions, NPO status , Patient's Chart, lab work & pertinent test results  History of Anesthesia Complications Negative for: history of anesthetic complications  Airway Mallampati: II  TM Distance: >3 FB Neck ROM: Full    Dental  (+) Dental Advisory Given   Pulmonary neg pulmonary ROS   breath sounds clear to auscultation       Cardiovascular negative cardio ROS  Rhythm:Regular Rate:Normal     Neuro/Psych negative neurological ROS     GI/Hepatic negative GI ROS, Neg liver ROS,,,  Endo/Other  negative endocrine ROS    Renal/GU negative Renal ROS     Musculoskeletal   Abdominal   Peds  Hematology negative hematology ROS (+)   Anesthesia Other Findings   Reproductive/Obstetrics                             Anesthesia Physical Anesthesia Plan  ASA: 2  Anesthesia Plan: General   Post-op Pain Management: Regional block* and Tylenol PO (pre-op)*   Induction: Intravenous  PONV Risk Score and Plan: 2 and Ondansetron and Treatment may vary due to age or medical condition  Airway Management Planned: LMA  Additional Equipment: None  Intra-op Plan:   Post-operative Plan:   Informed Consent: I have reviewed the patients History and Physical, chart, labs and discussed the procedure including the risks, benefits and alternatives for the proposed anesthesia with the patient or authorized representative who has indicated his/her understanding and acceptance.     Dental advisory given  Plan Discussed with: CRNA and Surgeon  Anesthesia Plan Comments: (Plan routine monitors GA with adductor canal block for post op analgesia)        Anesthesia Quick Evaluation

## 2023-01-29 ENCOUNTER — Encounter (HOSPITAL_COMMUNITY)
Admission: RE | Disposition: A | Discharge status: Home / Self Care | Payer: Self-pay | Source: Ambulatory Visit | Attending: Student

## 2023-01-29 ENCOUNTER — Encounter (HOSPITAL_COMMUNITY): Payer: Self-pay | Admitting: Student

## 2023-01-29 ENCOUNTER — Ambulatory Visit (HOSPITAL_COMMUNITY): Payer: Self-pay | Admitting: Anesthesiology

## 2023-01-29 ENCOUNTER — Other Ambulatory Visit: Payer: Self-pay

## 2023-01-29 ENCOUNTER — Ambulatory Visit (HOSPITAL_COMMUNITY)
Admission: RE | Admit: 2023-01-29 | Discharge: 2023-01-29 | Disposition: A | Discharge status: Home / Self Care | Payer: Self-pay | Source: Ambulatory Visit | Attending: Student | Admitting: Student

## 2023-01-29 ENCOUNTER — Ambulatory Visit (HOSPITAL_COMMUNITY): Payer: Self-pay

## 2023-01-29 DIAGNOSIS — W010XXA Fall on same level from slipping, tripping and stumbling without subsequent striking against object, initial encounter: Secondary | ICD-10-CM | POA: Insufficient documentation

## 2023-01-29 DIAGNOSIS — S82032A Displaced transverse fracture of left patella, initial encounter for closed fracture: Secondary | ICD-10-CM | POA: Insufficient documentation

## 2023-01-29 DIAGNOSIS — S82002A Unspecified fracture of left patella, initial encounter for closed fracture: Secondary | ICD-10-CM

## 2023-01-29 HISTORY — PX: ORIF PATELLA: SHX5033

## 2023-01-29 LAB — CBC
HCT: 39.1 % (ref 36.0–46.0)
Hemoglobin: 12.1 g/dL (ref 12.0–15.0)
MCH: 24.7 pg — ABNORMAL LOW (ref 26.0–34.0)
MCHC: 30.9 g/dL (ref 30.0–36.0)
MCV: 80 fL (ref 80.0–100.0)
Platelets: 303 10*3/uL (ref 150–400)
RBC: 4.89 MIL/uL (ref 3.87–5.11)
RDW: 16.7 % — ABNORMAL HIGH (ref 11.5–15.5)
WBC: 7.3 10*3/uL (ref 4.0–10.5)
nRBC: 0.3 % — ABNORMAL HIGH (ref 0.0–0.2)

## 2023-01-29 SURGERY — OPEN REDUCTION INTERNAL FIXATION (ORIF) PATELLA
Anesthesia: General | Site: Knee | Laterality: Left

## 2023-01-29 MED ORDER — OXYCODONE HCL 5 MG/5ML PO SOLN
5.0000 mg | Freq: Once | ORAL | Status: AC | PRN
Start: 2023-01-29 — End: 2023-01-29

## 2023-01-29 MED ORDER — PHENYLEPHRINE HCL-NACL 20-0.9 MG/250ML-% IV SOLN
INTRAVENOUS | Status: DC | PRN
  Administered 2023-01-29: 40 ug/min via INTRAVENOUS

## 2023-01-29 MED ORDER — OXYCODONE HCL 5 MG PO TABS: 5.0000 mg | ORAL_TABLET | ORAL | 0 refills | Status: AC | PRN

## 2023-01-29 MED ORDER — ACETAMINOPHEN 500 MG PO TABS
1000.0000 mg | ORAL_TABLET | Freq: Once | ORAL | Status: AC
  Administered 2023-01-29: 1000 mg via ORAL
  Filled 2023-01-29: qty 2

## 2023-01-29 MED ORDER — 0.9 % SODIUM CHLORIDE (POUR BTL) OPTIME
TOPICAL | Status: DC | PRN
  Administered 2023-01-29: 1000 mL

## 2023-01-29 MED ORDER — FENTANYL CITRATE (PF) 250 MCG/5ML IJ SOLN
INTRAMUSCULAR | Status: AC
  Filled 2023-01-29: qty 5

## 2023-01-29 MED ORDER — VANCOMYCIN HCL 1000 MG IV SOLR
INTRAVENOUS | Status: DC | PRN
  Administered 2023-01-29: 1000 mg via TOPICAL

## 2023-01-29 MED ORDER — HYDROMORPHONE HCL 1 MG/ML IJ SOLN
INTRAMUSCULAR | Status: AC
  Filled 2023-01-29: qty 1

## 2023-01-29 MED ORDER — SODIUM CHLORIDE 0.9 % IV SOLN
INTRAVENOUS | Status: DC
Start: 2023-01-29 — End: 2023-01-29

## 2023-01-29 MED ORDER — BUPIVACAINE-EPINEPHRINE (PF) 0.5% -1:200000 IJ SOLN
INTRAMUSCULAR | Status: DC | PRN
  Administered 2023-01-29: 20 mL via PERINEURAL

## 2023-01-29 MED ORDER — ASPIRIN 81 MG PO TBEC: 81.0000 mg | DELAYED_RELEASE_TABLET | Freq: Every day | ORAL | 12 refills | Status: AC

## 2023-01-29 MED ORDER — ONDANSETRON HCL 4 MG/2ML IJ SOLN
INTRAMUSCULAR | Status: AC
  Filled 2023-01-29: qty 2

## 2023-01-29 MED ORDER — PROPOFOL 10 MG/ML IV BOLUS
INTRAVENOUS | Status: AC
  Filled 2023-01-29: qty 20

## 2023-01-29 MED ORDER — LIDOCAINE 2% (20 MG/ML) 5 ML SYRINGE
INTRAMUSCULAR | Status: DC | PRN
  Administered 2023-01-29: 30 mg via INTRAVENOUS

## 2023-01-29 MED ORDER — CEFAZOLIN SODIUM-DEXTROSE 2-4 GM/100ML-% IV SOLN
2.0000 g | INTRAVENOUS | Status: AC
Start: 2023-01-29 — End: 2023-01-29
  Administered 2023-01-29: 2 g via INTRAVENOUS
  Filled 2023-01-29: qty 100

## 2023-01-29 MED ORDER — PHENYLEPHRINE 80 MCG/ML (10ML) SYRINGE FOR IV PUSH (FOR BLOOD PRESSURE SUPPORT)
PREFILLED_SYRINGE | INTRAVENOUS | Status: AC
  Filled 2023-01-29: qty 10

## 2023-01-29 MED ORDER — CHLORHEXIDINE GLUCONATE 0.12 % MT SOLN
15.0000 mL | Freq: Once | OROMUCOSAL | Status: AC
  Administered 2023-01-29: 15 mL via OROMUCOSAL
  Filled 2023-01-29: qty 15

## 2023-01-29 MED ORDER — ORAL CARE MOUTH RINSE
15.0000 mL | Freq: Once | OROMUCOSAL | Status: AC
Start: 2023-01-29 — End: 2023-01-29

## 2023-01-29 MED ORDER — MEPERIDINE HCL 25 MG/ML IJ SOLN: 6.2500 mg | INTRAMUSCULAR | Status: DC | PRN

## 2023-01-29 MED ORDER — SODIUM CHLORIDE 0.9 % IV SOLN: INTRAVENOUS | Status: DC | PRN

## 2023-01-29 MED ORDER — DEXAMETHASONE SODIUM PHOSPHATE 10 MG/ML IJ SOLN
INTRAMUSCULAR | Status: DC | PRN
  Administered 2023-01-29: 5 mg via INTRAVENOUS

## 2023-01-29 MED ORDER — LIDOCAINE 2% (20 MG/ML) 5 ML SYRINGE
INTRAMUSCULAR | Status: AC
  Filled 2023-01-29: qty 5

## 2023-01-29 MED ORDER — METHOCARBAMOL 500 MG PO TABS
750.0000 mg | ORAL_TABLET | Freq: Four times a day (QID) | ORAL | 0 refills | Status: AC | PRN
Start: 2023-01-29 — End: ?

## 2023-01-29 MED ORDER — MIDAZOLAM HCL 2 MG/2ML IJ SOLN
0.5000 mg | Freq: Once | INTRAMUSCULAR | Status: DC | PRN
Start: 2023-01-29 — End: 2023-01-29

## 2023-01-29 MED ORDER — VANCOMYCIN HCL 1000 MG IV SOLR
INTRAVENOUS | Status: AC
  Filled 2023-01-29: qty 20

## 2023-01-29 MED ORDER — ONDANSETRON HCL 4 MG/2ML IJ SOLN
INTRAMUSCULAR | Status: DC | PRN
  Administered 2023-01-29: 4 mg via INTRAVENOUS

## 2023-01-29 MED ORDER — PROPOFOL 10 MG/ML IV BOLUS
INTRAVENOUS | Status: DC | PRN
  Administered 2023-01-29: 50 mg via INTRAVENOUS
  Administered 2023-01-29: 100 mg via INTRAVENOUS
  Administered 2023-01-29: 50 mg via INTRAVENOUS

## 2023-01-29 MED ORDER — MIDAZOLAM HCL 2 MG/2ML IJ SOLN
INTRAMUSCULAR | Status: DC | PRN
  Administered 2023-01-29: 1 mg via INTRAVENOUS

## 2023-01-29 MED ORDER — HYDROMORPHONE HCL 1 MG/ML IJ SOLN
0.2500 mg | INTRAMUSCULAR | Status: DC | PRN
  Administered 2023-01-29 (×4): 0.5 mg via INTRAVENOUS

## 2023-01-29 MED ORDER — MIDAZOLAM HCL 2 MG/2ML IJ SOLN
INTRAMUSCULAR | Status: AC
Start: 2023-01-29 — End: ?
  Filled 2023-01-29: qty 2

## 2023-01-29 MED ORDER — PHENYLEPHRINE 80 MCG/ML (10ML) SYRINGE FOR IV PUSH (FOR BLOOD PRESSURE SUPPORT)
PREFILLED_SYRINGE | INTRAVENOUS | Status: DC | PRN
  Administered 2023-01-29: 80 ug via INTRAVENOUS
  Administered 2023-01-29: 160 ug via INTRAVENOUS

## 2023-01-29 MED ORDER — EPHEDRINE SULFATE-NACL 50-0.9 MG/10ML-% IV SOSY
PREFILLED_SYRINGE | INTRAVENOUS | Status: DC | PRN
  Administered 2023-01-29 (×2): 10 mg via INTRAVENOUS

## 2023-01-29 MED ORDER — FENTANYL CITRATE (PF) 250 MCG/5ML IJ SOLN
INTRAMUSCULAR | Status: DC | PRN
  Administered 2023-01-29: 50 ug via INTRAVENOUS
  Administered 2023-01-29: 25 ug via INTRAVENOUS
  Administered 2023-01-29: 50 ug via INTRAVENOUS

## 2023-01-29 MED ORDER — OXYCODONE HCL 5 MG PO TABS
5.0000 mg | ORAL_TABLET | Freq: Once | ORAL | Status: AC | PRN
  Administered 2023-01-29: 5 mg via ORAL

## 2023-01-29 MED ORDER — EPHEDRINE 5 MG/ML INJ
INTRAVENOUS | Status: AC
  Filled 2023-01-29: qty 5

## 2023-01-29 MED ORDER — DEXAMETHASONE SODIUM PHOSPHATE 10 MG/ML IJ SOLN
INTRAMUSCULAR | Status: AC
  Filled 2023-01-29: qty 1

## 2023-01-29 MED ORDER — OXYCODONE HCL 5 MG PO TABS
ORAL_TABLET | ORAL | Status: AC
  Filled 2023-01-29: qty 1

## 2023-01-29 MED ORDER — ACETAMINOPHEN 500 MG PO TABS: 500.0000 mg | ORAL_TABLET | Freq: Four times a day (QID) | ORAL | Status: AC | PRN

## 2023-01-29 SURGICAL SUPPLY — 46 items
ANCHOR JUGGERKNOT SOFT 1.5 (Anchor) ×2 IMPLANT
ANCHOR JUGGERKNOT SOFT 2.9 (Anchor) IMPLANT
BAG COUNTER SPONGE SURGICOUNT (BAG) ×1 IMPLANT
BENZOIN TINCTURE PRP APPL 2/3 (GAUZE/BANDAGES/DRESSINGS) IMPLANT
BIT DRILL JUGRKNT W/NDL BIT2.9 (DRILL) IMPLANT
BLADE CLIPPER SURG (BLADE) ×1 IMPLANT
BNDG ELASTIC 4X5.8 VLCR STR LF (GAUZE/BANDAGES/DRESSINGS) ×1 IMPLANT
BNDG ELASTIC 6X10 VLCR STRL LF (GAUZE/BANDAGES/DRESSINGS) IMPLANT
BNDG ELASTIC 6X5.8 VLCR STR LF (GAUZE/BANDAGES/DRESSINGS) ×1 IMPLANT
CHLORAPREP W/TINT 26 (MISCELLANEOUS) ×1 IMPLANT
CLSR STERI-STRIP ANTIMIC 1/2X4 (GAUZE/BANDAGES/DRESSINGS) IMPLANT
COVER SURGICAL LIGHT HANDLE (MISCELLANEOUS) ×1 IMPLANT
CUFF TRNQT CYL 34X4.125X (TOURNIQUET CUFF) ×1 IMPLANT
DERMABOND ADVANCED .7 DNX12 (GAUZE/BANDAGES/DRESSINGS) ×1 IMPLANT
DRAPE C-ARM 42X72 X-RAY (DRAPES) ×1 IMPLANT
DRAPE C-ARMOR (DRAPES) ×1 IMPLANT
DRAPE HALF SHEET 40X57 (DRAPES) ×1 IMPLANT
DRAPE IMP U-DRAPE 54X76 (DRAPES) ×1 IMPLANT
DRAPE SURG ORHT 6 SPLT 77X108 (DRAPES) ×2 IMPLANT
DRAPE U-SHAPE 47X51 STRL (DRAPES) ×1 IMPLANT
DRILL JUGGERKNOT W/NDL BIT 2.9 (DRILL) ×1
ELECT REM PT RETURN 9FT ADLT (ELECTROSURGICAL) ×1
ELECTRODE REM PT RTRN 9FT ADLT (ELECTROSURGICAL) ×1 IMPLANT
GAUZE PAD ABD 8X10 STRL (GAUZE/BANDAGES/DRESSINGS) IMPLANT
GAUZE SPONGE 4X4 12PLY STRL (GAUZE/BANDAGES/DRESSINGS) IMPLANT
GLOVE BIO SURGEON STRL SZ 6.5 (GLOVE) ×3 IMPLANT
GLOVE BIO SURGEON STRL SZ7.5 (GLOVE) ×4 IMPLANT
GLOVE BIOGEL PI IND STRL 6.5 (GLOVE) ×1 IMPLANT
GLOVE BIOGEL PI IND STRL 7.5 (GLOVE) ×1 IMPLANT
GOWN STRL REUS W/ TWL LRG LVL3 (GOWN DISPOSABLE) ×2 IMPLANT
IMMOBILIZER KNEE 22 UNIV (SOFTGOODS) ×1 IMPLANT
KIT BASIN OR (CUSTOM PROCEDURE TRAY) ×1 IMPLANT
KIT TURNOVER KIT B (KITS) ×1 IMPLANT
MANIFOLD NEPTUNE II (INSTRUMENTS) ×1 IMPLANT
NS IRRIG 1000ML POUR BTL (IV SOLUTION) ×1 IMPLANT
PACK GENERAL/GYN (CUSTOM PROCEDURE TRAY) ×1 IMPLANT
PAD ARMBOARD 7.5X6 YLW CONV (MISCELLANEOUS) ×2 IMPLANT
PADDING CAST ABS COTTON 4X4 ST (CAST SUPPLIES) IMPLANT
PADDING CAST ABS COTTON 6X4 NS (CAST SUPPLIES) IMPLANT
SUT MNCRL AB 3-0 PS2 18 (SUTURE) ×1 IMPLANT
SUT VIC AB 0 CT1 27XBRD ANBCTR (SUTURE) ×1 IMPLANT
SUT VIC AB 1 CT1 27XBRD ANBCTR (SUTURE) ×1 IMPLANT
SUT VIC AB 2-0 CT1 TAPERPNT 27 (SUTURE) ×1 IMPLANT
TOWEL GREEN STERILE (TOWEL DISPOSABLE) ×1 IMPLANT
UNDERPAD 30X36 HEAVY ABSORB (UNDERPADS AND DIAPERS) ×1 IMPLANT
WATER STERILE IRR 1000ML POUR (IV SOLUTION) ×1 IMPLANT

## 2023-01-29 NOTE — Op Note (Signed)
Orthopaedic Surgery Operative Note (CSN: [id number] ) Date of Surgery: 01/29/2023  Admit Date: 01/29/2023   Diagnoses: Pre-Op Diagnoses: Left patella fracture  Post-Op Diagnosis: Same  Procedures: CPT 27524-Open repair of left patella fracture  Surgeons : Primary: Roby Lofts, MD  Assistant: Ulyses Southward, PA-C  Location: OR 5   Anesthesia: General with regional block   Antibiotics: Ancef 2g preop with 1 gm vancomycin powder placed topically   Tourniquet time: None used    Estimated Blood Loss: Minimal  Complications:* No complications entered in OR log *   Specimens:* No specimens in log *   Implants: Implant Name Type Inv. Item Serial No. Manufacturer Lot No. LRB No. Used Action  ANCHOR JUGGERKNOT SOFT 1.5 - [id number] Anchor ANCHOR JUGGERKNOT SOFT 1.5  [employer](ORTH,TRAU,BIO,SG) [id number] Left 1 Implanted  ANCHOR JUGGERKNOT SOFT 1.5 - [id number] Anchor ANCHOR JUGGERKNOT SOFT 1.5  [employer](ORTH,TRAU,BIO,SG) [id number] Left 1 Implanted     Indications for Surgery: 62 year old female who fell sustaining a left patella fracture.  Due to the unstable nature of her injury I recommend proceeding with open reduction and repair.  Risks and benefits were discussed with the patient and her daughter.  Risks included but not limited to bleeding, infection, malunion, nonunion, hardware failure, hardware rotation, nerve and blood vessel injury, knee stiffness, posttraumatic arthritis, even the possibility anesthetic complications.  They agreed to proceed with surgery and consent was obtained.  Operative Findings: Repair of left patella fracture using two [employer] Juggerknot suture anchors  Procedure: The patient was identified in the preoperative holding area. Consent was confirmed with the patient and their family and all questions were answered. The operative extremity was marked after confirmation with the patient. she was then brought back to the operating room by  our anesthesia colleagues.  She was transferred over to radiolucent flattop table.  She was placed under general anesthetic.  The left lower extremity was then prepped and draped in usual sterile fashion.  A timeout was performed to verify the patient, the procedure, and the extremity.  Preoperative antibiotics were dosed.  Fluoroscopic imaging showed the unstable nature of her injury.  A direct anterior approach to the patella was made and carried down through skin and subcutaneous tissue.  I then cleaned out the hematoma and prepared the proximal patella fragment.  The inferior pole patella was without any articular surface and it was within the patellar tendon.  I did not feel any internal fixation would be appropriate and I felt that suture anchor fixation would be the best approach.  The patella was prepped and drilled and a suture anchors were placed 1 medial and 1 lateral.  I then used a Krakw suture to go through the patellar tendon on the lateral aspect.  I brought the other limb of the suture through the patellar tendon.  I then repeated the process medially.  I then had my assistant hold the sutures tight while I tied these down.  I had an excellent repair with no gapping at the fracture site.  I then brought the other limbs of the suture anchor medially and laterally to close the retinaculum.  I did this in a running locking suture.  I tied these down to reinforce the repair.  Final fluoroscopic imaging was obtained which showed reduction of the inferior pole patella fragment.  I then flexed the knee and there was no gapping up to 30 degrees of flexion.  I did not pressure further than this.  The incision was  copiously irrigated.  A gram of vancomycin powder was placed to the incision.  A layered closure of 0 Vicryl, 2-0 Monocryl 3-0 Monocryl with Dermabond was used to close the skin.  Sterile dressings were applied.  The patient was then awoke from anesthesia and taken to the PACU in stable  condition.  Post Op Plan/Instructions: The patient will be weightbearing as tolerated with her knee locked in extension.  She may flex up to 30 degrees.  She will discharge home from the PACU.  She will be on aspirin 81 mg for DVT prophylaxis.  She will return in 2 weeks for x-rays and wound check.  I was present and performed the entire surgery.  Ulyses Southward, PA-C did assist me throughout the case. An assistant was necessary given the difficulty in approach, maintenance of reduction and ability to instrument the fracture.   Truitt Merle, MD [facility]

## 2023-01-29 NOTE — Anesthesia Procedure Notes (Signed)
Anesthesia Regional Block: Adductor canal block   Pre-Anesthetic Checklist: , timeout performed,  Correct Patient, Correct Site, Correct Laterality,  Correct Procedure, Correct Position, site marked,  Risks and benefits discussed,  Surgical consent,  Pre-op evaluation,  At surgeon's request and post-op pain management  Laterality: Left and Lower  Prep: chloraprep       Needles:  Injection technique: Single-shot  Needle Type: Echogenic Needle     Needle Length: 9cm  Needle Gauge: 21     Additional Needles:   Procedures:,,,, ultrasound used (permanent image in chart),,    Narrative:  Start time: 01/29/2023 6:52 AM End time: 01/29/2023 6:58 AM Injection made incrementally with aspirations every 5 mL.  Performed by: Personally  Anesthesiologist: Jairo Ben, MD  Additional Notes: Pt identified in Holding room.  Monitors applied. Working IV access confirmed. Timeout, Sterile prep L thigh.  #21ga ECHOgenic Arrow block needle into adductor canal with US guidance.  20cc 0.5% Bupivacaine 1:200k epi injected incrementally after negative test dose.  Patient asymptomatic, VSS, no heme aspirated, tolerated well.   Sandford Craze, MD

## 2023-01-29 NOTE — Anesthesia Postprocedure Evaluation (Signed)
Anesthesia Post Note  Patient: [employer] manager  Procedure(s) Performed: OPEN REDUCTION INTERNAL FIXATION (ORIF) PATELLA (Left: Knee)     Patient location during evaluation: PACU Anesthesia Type: General Level of consciousness: awake and alert, patient cooperative and oriented Pain management: pain level controlled Vital Signs Assessment: post-procedure vital signs reviewed and stable Respiratory status: spontaneous breathing, nonlabored ventilation and respiratory function stable Cardiovascular status: blood pressure returned to baseline and stable Postop Assessment: no apparent nausea or vomiting Anesthetic complications: no   No notable events documented.  Last Vitals:  Vitals:   01/29/23 1045 01/29/23 1100  BP: (!) 140/80 135/70  Pulse: 81 75  Resp: 14 13  Temp:  36.8 C  SpO2: 94% 94%    Last Pain:  Vitals:   01/29/23 1100  TempSrc:   PainSc: 3                  Donnella Morford,E. Amaiyah Nordhoff

## 2023-01-29 NOTE — Discharge Instructions (Addendum)
[facility] Discharge Instructions   General Discharge Instructions  WEIGHT BEARING STATUS:weightbearing as tolerated in knee immobilizer  RANGE OF MOTION/ACTIVITY: Ok for 0-30 degrees knee flexion out of immobilizer. Must have immobilizer on at all other times when not working on knee motion  Wound Care: You may remove your surgical dressing on post op day 2, (SUNDAY 01/31/23). Incisions can be left open to air if there is no drainage. Once the incision is completely dry and without drainage, it may be left open to air out.  Showering may begin post op day 3, (MONDAY 02/01/23).  Clean incision gently with soap and water.  DVT/PE prophylaxis: Aspirin 81 mg daily x 30 days  Diet: as you were eating previously.  Can use over the counter stool softeners and bowel preparations, such as Miralax, to help with bowel movements.  Narcotics can be constipating.  Be sure to drink plenty of fluids  PAIN MEDICATION USE AND EXPECTATIONS  You have likely been given narcotic medications to help control your pain.  After a traumatic event that results in an fracture (broken bone) with or without surgery, it is ok to use narcotic pain medications to help control one's pain.  We understand that everyone responds to pain differently and each individual patient will be evaluated on a regular basis for the continued need for narcotic medications. Ideally, narcotic medication use should last no more than 6-8 weeks (coinciding with fracture healing).   As a patient it is your responsibility as well to monitor narcotic medication use and report the amount and frequency you use these medications when you come to your office visit.   We would also advise that if you are using narcotic medications, you should take a dose prior to therapy to maximize you participation.  IF YOU ARE ON NARCOTIC MEDICATIONS IT IS NOT PERMISSIBLE TO OPERATE A MOTOR VEHICLE (MOTORCYCLE/CAR/TRUCK/MOPED) OR HEAVY MACHINERY DO  NOT MIX NARCOTICS WITH OTHER CNS (CENTRAL NERVOUS SYSTEM) DEPRESSANTS SUCH AS ALCOHOL   STOP SMOKING OR USING NICOTINE PRODUCTS!!!!  As discussed nicotine severely impairs your body's ability to heal surgical and traumatic wounds but also impairs bone healing.  Wounds and bone heal by forming microscopic blood vessels (angiogenesis) and nicotine is a vasoconstrictor (essentially, shrinks blood vessels).  Therefore, if vasoconstriction occurs to these microscopic blood vessels they essentially disappear and are unable to deliver necessary nutrients to the healing tissue.  This is one modifiable factor that you can do to dramatically increase your chances of healing your injury.    (This means no smoking, no nicotine gum, patches, etc)  DO NOT USE NONSTEROIDAL ANTI-INFLAMMATORY DRUGS (NSAID'S)  Using products such as Advil (ibuprofen), Aleve (naproxen), Motrin (ibuprofen) for additional pain control during fracture healing can delay and/or prevent the healing response.  If you would like to take over the counter (OTC) medication, Tylenol (acetaminophen) is ok.  However, some narcotic medications that are given for pain control contain acetaminophen as well. Therefore, you should not exceed more than 4000 mg of tylenol in a day if you do not have liver disease.  Also note that there are may OTC medicines, such as cold medicines and allergy medicines that my contain tylenol as well.  If you have any questions about medications and/or interactions please ask your doctor/PA or your pharmacist.      ICE AND ELEVATE INJURED/OPERATIVE EXTREMITY  Using ice and elevating the injured extremity above your heart can help with swelling and pain control.  Icing in  a pulsatile fashion, such as 20 minutes on and 20 minutes off, can be followed.    Do not place ice directly on skin. Make sure there is a barrier between to skin and the ice pack.    Using frozen items such as frozen peas works well as the conform nicely to  the are that needs to be iced.  USE AN ACE WRAP OR TED HOSE FOR SWELLING CONTROL  In addition to icing and elevation, Ace wraps or TED hose are used to help limit and resolve swelling.  It is recommended to use Ace wraps or TED hose until you are informed to stop.    When using Ace Wraps start the wrapping distally (farthest away from the body) and wrap proximally (closer to the body)   Example: If you had surgery on your leg or thing and you do not have a splint on, start the ace wrap at the toes and work your way up to the thigh        If you had surgery on your upper extremity and do not have a splint on, start the ace wrap at your fingers and work your way up to the upper arm    CALL THE OFFICE WITH ANY QUESTIONS OR CONCERNS: [phone]   VISIT OUR WEBSITE FOR ADDITIONAL INFORMATION: [email or web address]    Discharge Wound Care Instructions  Do NOT apply any ointments, solutions or lotions to pin sites or surgical wounds.  These prevent needed drainage and even though solutions like hydrogen peroxide kill bacteria, they also damage cells lining the pin sites that help fight infection.  Applying lotions or ointments can keep the wounds moist and can cause them to breakdown and open up as well. This can increase the risk for infection. When in doubt call the office.  Surgical incisions should be dressed daily.  If any drainage is noted, use one layer of adaptic or Mepitel, then gauze, Kerlix, and an ace wrap. - These dressing supplies should be available at local medical supply stores [facility], [facility], etc) as well as Insurance claims handler ([employer], [facility], [employer], etc)  Once the incision is completely dry and without drainage, it may be left open to air out.  Showering may begin 36-48 hours later.  Cleaning gently with soap and water.

## 2023-01-29 NOTE — Interval H&P Note (Signed)
History and Physical Interval Note:  01/29/2023 7:25 AM  Donna Dixon  has presented today for surgery, with the diagnosis of Left patella fracture.  The various methods of treatment have been discussed with the patient and family. After consideration of risks, benefits and other options for treatment, the patient has consented to  Procedure(s): OPEN REDUCTION INTERNAL FIXATION (ORIF) PATELLA (Left) as a surgical intervention.  The patient's history has been reviewed, patient examined, no change in status, stable for surgery.  I have reviewed the patient's chart and labs.  Questions were answered to the patient's satisfaction.     Caryn Bee P Sheila Gervasi

## 2023-01-29 NOTE — Transfer of Care (Signed)
Immediate Anesthesia Transfer of Care Note  Patient: Donna Dixon  Procedure(s) Performed: OPEN REDUCTION INTERNAL FIXATION (ORIF) PATELLA (Left: Knee)  Patient Location: PACU  Anesthesia Type:GA combined with regional for post-op pain  Level of Consciousness: drowsy  Airway & Oxygen Therapy: Patient Spontanous Breathing  Post-op Assessment: Report given to RN and Post -op Vital signs reviewed and stable  Post vital signs: Reviewed and stable  Last Vitals:  Vitals Value Taken Time  BP 161/74 01/29/23 0907  Temp    Pulse 95 01/29/23 0909  Resp 12 01/29/23 0909  SpO2 97 % 01/29/23 0909  Vitals shown include unfiled device data.  Last Pain:  Vitals:   01/29/23 0614  TempSrc:   PainSc: 0-No pain         Complications: No notable events documented.

## 2023-01-29 NOTE — Anesthesia Procedure Notes (Signed)
Procedure Name: Intubation Date/Time: 01/29/2023 7:42 AM  Performed by: Randon Goldsmith, CRNAPre-anesthesia Checklist: Patient identified, Emergency Drugs available, Suction available and Patient being monitored Patient Re-evaluated:Patient Re-evaluated prior to induction Oxygen Delivery Method: Circle system utilized Preoxygenation: Pre-oxygenation with 100% oxygen Induction Type: IV induction Ventilation: Mask ventilation without difficulty LMA: LMA inserted LMA Size: 4.0 Number of attempts: 1 Airway Equipment and Method: Bite block Placement Confirmation: positive ETCO2 and breath sounds checked- equal and bilateral Tube secured with: Tape Dental Injury: Teeth and Oropharynx as per pre-operative assessment

## 2023-02-01 ENCOUNTER — Encounter (HOSPITAL_COMMUNITY): Payer: Self-pay | Admitting: Student
